# Patient Record
Sex: Female | Born: 2005 | Race: White | Hispanic: No | Marital: Single | Smoking: Never smoker
Health system: Southern US, Community
[De-identification: ages and names within clinical notes are randomized; demographics above are authoritative.]

## PROBLEM LIST (undated history)

## (undated) DIAGNOSIS — J45909 Unspecified asthma, uncomplicated: Secondary | ICD-10-CM

## (undated) DIAGNOSIS — A6000 Herpesviral infection of urogenital system, unspecified: Secondary | ICD-10-CM

## (undated) HISTORY — PX: ADENOIDECTOMY: SUR15

---
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
## 2024-02-18 ENCOUNTER — Telehealth: Payer: Self-pay | Admitting: Nurse Practitioner

## 2024-02-18 NOTE — Telephone Encounter (Signed)
 Copied from CRM [id number]. Topic: Clinical - Refused Triage >> Feb 18, 2024  2:49 PM Antwanette L wrote: Tawni, the patient's mother, called to schedule an appointment for a pregnancy test. She reports the patient's face is breaking out and her breasts are sore.Asked the mother if she needed to speak with a nurse, and she declined.

## 2024-02-19 ENCOUNTER — Ambulatory Visit (INDEPENDENT_AMBULATORY_CARE_PROVIDER_SITE_OTHER): Admitting: Nurse Practitioner

## 2024-02-19 ENCOUNTER — Encounter: Payer: Self-pay | Admitting: Nurse Practitioner

## 2024-02-19 VITALS — BP 111/72 | HR 92 | Temp 98.2°F | Resp 16 | Ht 63.03 in | Wt 124.0 lb

## 2024-02-19 DIAGNOSIS — N644 Mastodynia: Secondary | ICD-10-CM | POA: Insufficient documentation

## 2024-02-19 LAB — PREGNANCY, URINE: Preg Test, Ur: NEGATIVE

## 2024-02-19 NOTE — Progress Notes (Signed)
 "  BP 111/72 (BP Location: Left Arm, Patient Position: Sitting, Cuff Size: Normal)   Pulse 92   Temp 98.2 F (36.8 C) (Oral)   Resp 16   Ht 5' 3.03 (1.601 m)   Wt 124 lb (56.2 kg)   SpO2 97%   BMI 21.94 kg/m    Subjective:    Patient ID: Holly Santiago, female    DOB: 2005-02-09, 18 y.o.   MRN: [id number]  HPI: Holly Santiago is a 19 y.o. female  Chief Complaint  Patient presents with   Pregnancy    Concerns for pregnancy, eating more, sleeping more than usual, dizzy, and its been about a month almost 2 no cycle. Was cramping a little last night had sharp pains but they went away.   PREGNANCY DIAGNOSIS Breasts have been sore, breaking out a lot, eating more often, had a dizzy spell. Last cycle was 1-2 months ago. Has Mirena in place -- scheduled to come out in June or July. Follows with GYN. Gravida/Para: 0/0 Home pregnancy test: negative x2 x3 Nausea: no Vomiting: no Breast tenderness: yes Abdominal pain: a little cramping last night Vaginal bleeding: no Pregnancy or labor complications: no Fetal abnormalities: no Contraception:  Mirena  Relevant past medical, surgical, family and social history reviewed and updated as indicated. Interim medical history since our last visit reviewed. Allergies and medications reviewed and updated.  Review of Systems  Constitutional:  Positive for fatigue. Negative for activity change, appetite change, diaphoresis and fever.  Respiratory:  Negative for cough, chest tightness, shortness of breath and wheezing.   Cardiovascular:  Negative for chest pain, palpitations and leg swelling.  Gastrointestinal: Negative.   Genitourinary:  Negative for pelvic pain and vaginal bleeding.  Neurological:  Positive for dizziness (x 1 episode).  Psychiatric/Behavioral: Negative.      Per HPI unless specifically indicated above     Objective:    BP 111/72 (BP Location: Left Arm, Patient Position: Sitting, Cuff Size: Normal)   Pulse  92   Temp 98.2 F (36.8 C) (Oral)   Resp 16   Ht 5' 3.03 (1.601 m)   Wt 124 lb (56.2 kg)   SpO2 97%   BMI 21.94 kg/m   Wt Readings from Last 3 Encounters:  02/19/24 124 lb (56.2 kg) (42%, Z= -0.19)*  05/27/23 114 lb 14.4 oz (52.1 kg) (26%, Z= -0.63)*  03/10/23 106 lb 12.8 oz (48.4 kg) (12%, Z= -1.17)*   * Growth percentiles are based on CDC (Girls, 2-20 Years) data.    Physical Exam Vitals and nursing note reviewed.  Constitutional:      General: She is awake. She is not in acute distress.    Appearance: She is well-developed and well-groomed. She is not ill-appearing or toxic-appearing.  HENT:     Head: Normocephalic.     Right Ear: Hearing and external ear normal.     Left Ear: Hearing and external ear normal.  Eyes:     General: Lids are normal.        Right eye: No discharge.        Left eye: No discharge.     Conjunctiva/sclera: Conjunctivae normal.     Pupils: Pupils are equal, round, and reactive to light.  Neck:     Thyroid : No thyromegaly.     Vascular: No carotid bruit.  Cardiovascular:     Rate and Rhythm: Normal rate and regular rhythm.     Heart sounds: Normal heart sounds. No murmur heard.  No gallop.  Pulmonary:     Effort: Pulmonary effort is normal. No accessory muscle usage or respiratory distress.     Breath sounds: Normal breath sounds. No decreased breath sounds, wheezing or rales.  Abdominal:     General: Bowel sounds are normal. There is no distension.     Palpations: Abdomen is soft.     Tenderness: There is no abdominal tenderness.  Musculoskeletal:     Cervical back: Normal range of motion and neck supple.     Right lower leg: No edema.     Left lower leg: No edema.  Lymphadenopathy:     Cervical: No cervical adenopathy.  Skin:    General: Skin is warm and dry.  Neurological:     Mental Status: She is alert and oriented to person, place, and time.     Deep Tendon Reflexes: Reflexes are normal and symmetric.     Reflex Scores:       Brachioradialis reflexes are 2+ on the right side and 2+ on the left side.      Patellar reflexes are 2+ on the right side and 2+ on the left side. Psychiatric:        Attention and Perception: Attention normal.        Mood and Affect: Mood normal.        Speech: Speech normal.        Behavior: Behavior normal. Behavior is cooperative.        Thought Content: Thought content normal.     Results for orders placed or performed in visit on 05/27/23  POCT Urinalysis Dipstick   Collection Time: 05/27/23  3:21 PM  Result Value Ref Range   Color, UA     Clarity, UA     Glucose, UA Negative Negative   Bilirubin, UA Negative    Ketones, UA Negative    Spec Grav, UA 1.015 1.010 - 1.025   Blood, UA Negative    pH, UA 6.5 5.0 - 8.0   Protein, UA Positive (A) Negative   Urobilinogen, UA 0.2 0.2 or 1.0 E.U./dL   Nitrite, UA Negative    Leukocytes, UA Negative Negative   Appearance     Odor    Cervicovaginal ancillary only   Collection Time: 05/27/23  3:21 PM  Result Value Ref Range   Neisseria Gonorrhea Negative    Chlamydia Negative    Trichomonas Negative    Bacterial Vaginitis (gardnerella) Negative    Candida Vaginitis Positive (A)    Candida Glabrata Negative    Comment Normal Reference Range Candida Species - Negative    Comment Normal Reference Range Candida Galbrata - Negative    Comment Normal Reference Range Trichomonas - Negative    Comment Normal Reference Ranger Chlamydia - Negative    Comment      Normal Reference Range Neisseria Gonorrhea - Negative   Comment      Normal Reference Range Bacterial Vaginosis - Negative      Assessment & Plan:   Problem List Items Addressed This Visit       Other   Breast tenderness in female - Primary   Has Mirena in place but almost at end of dates and they report GYN will be removing. Urine pregnancy test negative. Will obtain blood work to further assess, will include HCG, TSH, CMP. Discussed findings with family and they  reported they are okay with negative testing, were hoping not yet.      Relevant Orders   Pregnancy, urine  Beta hCG quant (ref lab)   TSH   Comprehensive metabolic panel with GFR     Follow up plan: Return if symptoms worsen or fail to improve.      "

## 2024-02-19 NOTE — Assessment & Plan Note (Signed)
 Has Mirena in place but almost at end of dates and they report GYN will be removing. Urine pregnancy test negative. Will obtain blood work to further assess, will include HCG, TSH, CMP. Discussed findings with family and they reported they are okay with negative testing, were hoping not yet.

## 2024-02-19 NOTE — Patient Instructions (Signed)
 Levonorgestrel Intrauterine Device (IUD) What is this medication? LEVONORGESTREL (LEE voe nor jes trel) prevents ovulation and pregnancy. It may also be used to treat heavy periods. It belongs to a group of medications called contraceptives. This medication is a progestin hormone. This medicine may be used for other purposes; ask your health care provider or pharmacist if you have questions. COMMON BRAND NAME(S): Cameron Ali What should I tell my care team before I take this medication? They need to know if you have any of these conditions: Abnormal Pap smear Cancer of the breast, uterus, or cervix Diabetes Endometritis Genital or pelvic infection now or in the past Have more than one sexual partner or your partner has more than one partner Heart disease History of an ectopic or tubal pregnancy Immune system problems IUD in place Liver disease or tumor Problems with blood clots or take blood-thinners Seizures Use intravenous drugs Uterus of unusual shape Vaginal bleeding that has not been explained An unusual or allergic reaction to levonorgestrel, other hormones, silicone, or polyethylene, medications, foods, dyes, or preservatives Pregnant or trying to get pregnant Breast-feeding How should I use this medication? This device is placed inside the uterus by your care team. A patient package insert for the product will be given each time it is inserted. Be sure to read this information carefully each time. The sheet may change often. Talk to your care team about use of this medication in children. Special care may be needed. Overdosage: If you think you have taken too much of this medicine contact a poison control center or emergency room at once. NOTE: This medicine is only for you. Do not share this medicine with others. What if I miss a dose? This does not apply. Depending on the brand of device you have inserted, the device will need to be replaced every 3 to 8  years if you wish to continue using this type of birth control. What may interact with this medication? Interactions are not expected. Tell your care team about all the medications you take. This list may not describe all possible interactions. Give your health care provider a list of all the medicines, herbs, non-prescription drugs, or dietary supplements you use. Also tell them if you smoke, drink alcohol, or use illegal drugs. Some items may interact with your medicine. What should I watch for while using this medication? Visit your care team for regular check-ups. Tell your care team if you or your partner becomes HIV positive or gets a sexually transmitted disease. Using this medication does not protect you or your partner against HIV or other sexually transmitted infections (STIs). You can check the placement of the IUD yourself by reaching up to the top of your vagina with clean fingers to feel the threads. Do not pull on the threads. It is a good habit to check placement after each menstrual period. Call your care team right away if you feel more of the IUD than just the threads or if you cannot feel the threads at all. The IUD may come out by itself. You may become pregnant if the device comes out. If you notice that the IUD has come out use a backup birth control method like condoms and call your care team. Using tampons will not change the position of the IUD and are okay to use during your period. This IUD can be safely scanned with magnetic resonance imaging (MRI) only under specific conditions. Before you have an MRI, tell your care team  that you have an IUD in place, and which type of IUD you have in place. What side effects may I notice from receiving this medication? Side effects that you should report to your care team as soon as possible: Allergic reactions--skin rash, itching, hives, swelling of the face, lips, tongue, or throat Blood clot--pain, swelling, or warmth in the leg,  shortness of breath, chest pain Gallbladder problems--severe stomach pain, nausea, vomiting, fever Increase in blood pressure Liver injury--right upper belly pain, loss of appetite, nausea, light-colored stool, dark yellow or brown urine, yellowing skin or eyes, unusual weakness or fatigue New or worsening migraines or headaches Pelvic inflammatory disease (PID)--fever, abdominal pain, pelvic pain, pain or trouble passing urine, spotting, bleeding during or after sex Stroke--sudden numbness or weakness of the face, arm, or leg, trouble speaking, confusion, trouble walking, loss of balance or coordination, dizziness, severe headache, change in vision Unusual vaginal discharge, itching, or odor Vaginal pain, irritation, or sores Worsening mood, feelings of depression Side effects that usually do not require medical attention (report to your care team if they continue or are bothersome): Breast pain or tenderness Dark patches of skin on the face or other sun-exposed areas Irregular menstrual cycles or spotting Nausea Weight gain This list may not describe all possible side effects. Call your doctor for medical advice about side effects. You may report side effects to FDA at 1-800-FDA-1088. Where should I keep my medication? This does not apply. NOTE: This sheet is a summary. It may not cover all possible information. If you have questions about this medicine, talk to your doctor, pharmacist, or health care provider.  2024 Elsevier/Gold Standard (2020-10-03 00:00:00)

## 2024-02-20 ENCOUNTER — Ambulatory Visit: Payer: Self-pay | Admitting: Nurse Practitioner

## 2024-02-20 LAB — COMPREHENSIVE METABOLIC PANEL WITH GFR
ALT: 17 IU/L (ref 0–32)
AST: 20 IU/L (ref 0–40)
Albumin: 4.4 g/dL (ref 4.0–5.0)
Alkaline Phosphatase: 69 IU/L (ref 42–106)
BUN/Creatinine Ratio: 15 (ref 9–23)
BUN: 10 mg/dL (ref 6–20)
Bilirubin Total: 0.3 mg/dL (ref 0.0–1.2)
CO2: 19 mmol/L — ABNORMAL LOW (ref 20–29)
Calcium: 9.2 mg/dL (ref 8.7–10.2)
Chloride: 105 mmol/L (ref 96–106)
Creatinine, Ser: 0.67 mg/dL (ref 0.57–1.00)
Globulin, Total: 2.6 g/dL (ref 1.5–4.5)
Glucose: 85 mg/dL (ref 70–99)
Potassium: 4.2 mmol/L (ref 3.5–5.2)
Sodium: 141 mmol/L (ref 134–144)
Total Protein: 7 g/dL (ref 6.0–8.5)
eGFR: 129 mL/min/1.73

## 2024-02-20 LAB — BETA HCG QUANT (REF LAB): hCG Quant: <1 m[IU]/mL

## 2024-02-20 LAB — TSH: TSH: 0.961 u[IU]/mL (ref 0.450–4.500)

## 2024-02-20 NOTE — Progress Notes (Signed)
 Good morning, please let Holly Santiago know her pregnancy blood work did return showing no pregnancy. Remainder of labs, including thyroid , are all normal. Great news. Any questions? Have a wonderful day!!

## 2024-02-22 NOTE — Progress Notes (Unsigned)
" ° ° °  GYNECOLOGY PROGRESS NOTE  Subjective:    Patient ID: Holly Santiago, female    DOB: 06/23/2005, 19 y.o.   MRN: [id number]  HPI  Patient is a 19 y.o. G0P0000 female who presents for abdominal cramping with IUD and Amenorrhea. She currently has Kyleena  IUD that was placed on 07/12/2019.  The following portions of the patient's history were reviewed and updated as appropriate: allergies, current medications, past family history, past medical history, past social history, past surgical history, and problem list.  Review of Systems Pertinent items are noted in HPI.   Objective:   There were no vitals taken for this visit. There is no height or weight on file to calculate BMI. General appearance: alert, cooperative, and no distress Abdomen: {abdominal exam:16834} Pelvic: {pelvic exam:16852::cervix normal in appearance,external genitalia normal,no adnexal masses or tenderness,no cervical motion tenderness,rectovaginal septum normal,uterus normal size, shape, and consistency,vagina normal without discharge} Extremities: {extremity exam:5109} Neurologic: {neuro exam:17854}   Assessment:   No diagnosis found.   Plan:   There are no diagnoses linked to this encounter.    Damien Parsley, CNM [facility]

## 2024-02-24 ENCOUNTER — Ambulatory Visit: Admitting: Certified Nurse Midwife

## 2024-03-01 ENCOUNTER — Ambulatory Visit: Payer: Self-pay

## 2024-03-01 NOTE — Telephone Encounter (Signed)
 FYI Only or Action Required?: FYI only for provider: appointment scheduled on 1/28 and please note high potential for mold exposure [place] (infectious/allergenic/toxigenic).  Patient was last seen in primary care on 02/19/2024 by Valerio Melanie DASEN, NP.  Called Nurse Triage reporting Wound Infection.  Symptoms began one year ago.  Interventions attempted: OTC medications: antiseptic lidocaine  spray, Prescription medications: Triamcinolone and Amoxicillin  (1x use each, old prescriptions), Ice/heat application, and Other: manually drained sore with a needle.  Symptoms are: gradually worsening.  Triage Disposition: See Physician Within 24 Hours  Patient/caregiver understands and will follow disposition?: Yes     Message from [place] sent at 03/01/2024  4:09 PM EST  Reason for Triage: Patient's mother, Tawni, stated patient has had bump on head for a year, now causing pain and looking yellow. Lots of pus and causing headaches   Reason for Disposition  Sore is painful  Answer Assessment - Initial Assessment Questions 1. APPEARANCE: What does the rash look like?      Started out as a knot on head, not a bump but a knot, that could be felt more inside the scalp.  Doesn't move around.  Mother thought was an ingrown hair but patient was still having symptom now a year later.  Mother noticed it looked yellow in center and was dipping down, thought it was an infected hair follicle so took a small needle and punctured in two areas.  Very painful before and after manually opening area.  Significant drainage after opening up.   2. LOCATION: Where is the rash located?      scalp  3. NUMBER: How many sores are there?      1  4. SIZE: How big is the largest sore? (e.g., inches, cm; or compare to size of a coin)     Tip of finger size bubble area  5. ONSET: When did the sores start?     One year ago; sister also has had several herself that are similar.   6. OTHER  SYMPTOMS: Do you have any other symptoms? (e.g., fever, pain)  No fever or chills.   Temperature checked during call is 98.38F.   Draining yellow pus and also came out in a tubular fashion with a black tip at bottom; warm compresses are helping along with lidocaine  antiseptic spray;  has also been applying Triamcinolone antibiotic prescription and gave patient an old amoxicillin  tablet.  Is really sore today, patient can hardly touch area.   History of eczema and asthma for all patient and all siblings.   7. EXPOSURES: Has lived in current home for 2 years (grandmother's house).  No known water leaks, roof leaks, flooding or humidity concerns in the home.  None known in a prior home or at the local school.  Patient has eczema and allergies year round.  All siblings also need inhalers.  No musty odor.  [place]  No known tick exposures.  Have not done any dust testing or mycotoxin testing to check for hidden mold growth in environment.  Protocols used: Impetigo (Infected Sore)-A-AH

## 2024-03-02 ENCOUNTER — Ambulatory Visit

## 2024-03-02 VITALS — BP 101/66 | HR 81 | Temp 98.2°F | Resp 16 | Ht 63.03 in | Wt 130.4 lb

## 2024-03-02 DIAGNOSIS — L0291 Cutaneous abscess, unspecified: Secondary | ICD-10-CM

## 2024-03-02 MED ORDER — SULFAMETHOXAZOLE-TRIMETHOPRIM 800-160 MG PO TABS: 1.0000 | ORAL_TABLET | Freq: Two times a day (BID) | ORAL | 0 refills | Status: AC

## 2024-03-02 NOTE — Progress Notes (Signed)
 "  BP 101/66 (BP Location: Left Arm, Patient Position: Sitting, Cuff Size: Normal)   Pulse 81   Temp 98.2 F (36.8 C) (Oral)   Resp 16   Ht 5' 3.03 (1.601 m)   Wt 130 lb 6.4 oz (59.1 kg)   SpO2 98%   BMI 23.08 kg/m    Subjective:    Patient ID: Holly Santiago, female    DOB: [birth year], 19 y.o.   MRN: [id number]  HPI: Kynzee Devinney is a 19 y.o. female c/o cyst on her scalp that burst and is now oozing pus.  Denies fever/chills/nausea or vomiting.  Cyst present x1 year or greater. Very tender to palpation in clinic.      Chief Complaint  Patient presents with   Lesion    Lesion on her head that is starting to ooze out, creamy colored pus and hard. No smell, grandma states that it looks like a cyst to her.   Immunizations    Discuss overdue vaccines,    Relevant past medical, surgical, family and social history reviewed and updated as indicated. Interim medical history since our last visit reviewed. Allergies and medications reviewed and updated.  Review of Systems  Constitutional:  Negative for activity change, appetite change, chills and fever.  HENT:  Negative for sinus pain.   Respiratory:  Negative for cough, chest tightness and shortness of breath.   Cardiovascular:  Negative for chest pain.  Gastrointestinal:  Negative for nausea and vomiting.  Skin:  Positive for wound. Negative for color change.  Hematological:  Negative for adenopathy. Does not bruise/bleed easily.    Per HPI unless specifically indicated above     Objective:    BP 101/66 (BP Location: Left Arm, Patient Position: Sitting, Cuff Size: Normal)   Pulse 81   Temp 98.2 F (36.8 C) (Oral)   Resp 16   Ht 5' 3.03 (1.601 m)   Wt 130 lb 6.4 oz (59.1 kg)   SpO2 98%   BMI 23.08 kg/m   Wt Readings from Last 3 Encounters:  03/02/24 130 lb 6.4 oz (59.1 kg) (54%, Z= 0.11)*  02/19/24 124 lb (56.2 kg) (42%, Z= -0.19)*  05/27/23 114 lb 14.4 oz (52.1 kg) (26%, Z= -0.63)*   * Growth  percentiles are based on CDC (Girls, 2-20 Years) data.    Physical Exam Constitutional:      Appearance: Normal appearance.  HENT:     Head: Normocephalic and atraumatic.  Pulmonary:     Effort: Pulmonary effort is normal.  Musculoskeletal:     Cervical back: Normal range of motion.  Skin:    Findings: Lesion present.         Comments: Small pea sized cyst on scalp.  Scabbing present.  Purulent drainage noted.  Neurological:     Mental Status: She is alert.     Results for orders placed or performed in visit on 02/19/24  Pregnancy, urine   Collection Time: 02/19/24 10:01 AM  Result Value Ref Range   Preg Test, Ur Negative Negative  Beta hCG quant (ref lab)   Collection Time: 02/19/24 10:21 AM  Result Value Ref Range   hCG Quant <1 mIU/mL  TSH   Collection Time: 02/19/24 10:21 AM  Result Value Ref Range   TSH 0.961 0.450 - 4.500 uIU/mL  Comprehensive metabolic panel with GFR   Collection Time: 02/19/24 10:21 AM  Result Value Ref Range   Glucose 85 70 - 99 mg/dL   BUN 10 6 - 20  mg/dL   Creatinine, Ser 9.32 0.57 - 1.00 mg/dL   eGFR 870 >40 fO/fpw/8.26   BUN/Creatinine Ratio 15 9 - 23   Sodium 141 134 - 144 mmol/L   Potassium 4.2 3.5 - 5.2 mmol/L   Chloride 105 96 - 106 mmol/L   CO2 19 (L) 20 - 29 mmol/L   Calcium 9.2 8.7 - 10.2 mg/dL   Total Protein 7.0 6.0 - 8.5 g/dL   Albumin 4.4 4.0 - 5.0 g/dL   Globulin, Total 2.6 1.5 - 4.5 g/dL   Bilirubin Total 0.3 0.0 - 1.2 mg/dL   Alkaline Phosphatase 69 42 - 106 IU/L   AST 20 0 - 40 IU/L   ALT 17 0 - 32 IU/L      Assessment & Plan:   Assessment & Plan Abscess Non fluctuant pea sized open cyst on scalp.  Purulent drainage present.  Will send bactrim .  May use ibuprofen  for pain.  Apply warm wet compresses to area.  May follow up in 2 weeks if not improved. Orders:   sulfamethoxazole -trimethoprim  (BACTRIM  DS) 800-160 MG tablet; Take 1 tablet by mouth 2 (two) times daily for 7 days.   Follow up plan: No follow-ups  on file.      "

## 2024-03-10 ENCOUNTER — Ambulatory Visit: Payer: Self-pay

## 2024-03-10 NOTE — Telephone Encounter (Signed)
 FYI Only or Action Required?: FYI only for provider: Home care advised.  Patient was last seen in primary care on 03/02/2024 by Con Delon HERO, FNP.  Called Nurse Triage reporting Cyst.  Symptoms began 1 year ago.  Interventions attempted: Prescription medications: Bactrim  and Other: Numbing ointment, popped the cyst at home.  Symptoms are: rapidly improving.  Triage Disposition: Home Care  Patient/caregiver understands and will follow disposition?: Yes   OV on 1/28 for cyst on scalp that popped and was oozing pus. Cyst has been there 1 year. Prescribed bactrim . Finished rx yesterday. No more pain, redness or swelling since completing course. Just a scab. Pt wanting to know if another round of bactrim  needs to be prescribed for the scab. Advised home care and that scab should resolve on it's own with time. Advised to call back for worsening symptoms. Pt verbalized understanding.    Copied from CRM #[id number]. Topic: Clinical - Medication Question >> Mar 10, 2024  3:50 PM Antony S wrote: Reason for CRM: symptoms are better not completely gone needing a refill of sulfamethoxazole  Reason for Disposition  Caller has question and triager able to answer question  Answer Assessment - Initial Assessment Questions 1. APPEARANCE: What does the boil (abscess) look like?      Looks like a scab now  2. LOCATION: Where is the boil located?      Scalp  3. NUMBER: How many boils are there?      1  4. SIZE: How big is the boil? (e.g., inches, cm; compare to size of a coin or other object)     [place]  5. ONSET: When did the boil start?     1 year  6. PAIN: Is there any pain? If Yes, ask: How bad is the pain?  (Scale 1-10; or mild, moderate, severe)     None currently  7. FEVER: Do you have a fever? If Yes, ask: What is it, how was it measured, and when did it start?      No  8. TREATMENT: What treatment did you get or are you getting for the boil? (e.g., I&D,  antibiotics, moist heat)     Pt and her mom popped it at home. Numbing ointment. Bactrim   9. OTHER SYMPTOMS: Do you have any other symptoms? (e.g., rash elsewhere on body, shaking chills, spreading redness of nearby skin or red streaks, weakness)      No  10. PREGNANCY: Is there any chance you are pregnant? When was your last menstrual period?       No. LMP 2 weeks ago.  Protocols used: Boil (Skin Abscess) on Treatment Follow-up Call-A-AH

## 2024-03-22 ENCOUNTER — Ambulatory Visit: Admitting: Nurse Practitioner

## 2024-04-05 ENCOUNTER — Ambulatory Visit: Admitting: Certified Nurse Midwife
